# Patient Record
Sex: Female | Born: 2005
Health system: Southern US, Community
[De-identification: ages and names within clinical notes are randomized; demographics above are authoritative.]

---
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
## 2023-06-12 ENCOUNTER — Other Ambulatory Visit (HOSPITAL_COMMUNITY): Payer: Self-pay

## 2023-06-12 DIAGNOSIS — F411 Generalized anxiety disorder: Secondary | ICD-10-CM | POA: Diagnosis not present

## 2023-06-12 DIAGNOSIS — F3181 Bipolar II disorder: Secondary | ICD-10-CM | POA: Diagnosis not present

## 2023-06-12 DIAGNOSIS — F4010 Social phobia, unspecified: Secondary | ICD-10-CM | POA: Diagnosis not present

## 2023-06-12 DIAGNOSIS — F900 Attention-deficit hyperactivity disorder, predominantly inattentive type: Secondary | ICD-10-CM | POA: Diagnosis not present

## 2023-06-12 MED ORDER — QUETIAPINE FUMARATE 50 MG PO TABS
50.0000 mg | ORAL_TABLET | Freq: Every day | ORAL | 1 refills | Status: AC
  Filled 2023-06-12: qty 30, 30d supply, fill #0
  Filled 2023-07-07: qty 30, 30d supply, fill #1

## 2023-07-08 ENCOUNTER — Other Ambulatory Visit (HOSPITAL_COMMUNITY): Payer: Self-pay

## 2023-08-14 ENCOUNTER — Other Ambulatory Visit (HOSPITAL_COMMUNITY): Payer: Self-pay

## 2023-08-14 DIAGNOSIS — F4010 Social phobia, unspecified: Secondary | ICD-10-CM | POA: Diagnosis not present

## 2023-08-14 DIAGNOSIS — F3181 Bipolar II disorder: Secondary | ICD-10-CM | POA: Diagnosis not present

## 2023-08-14 DIAGNOSIS — F411 Generalized anxiety disorder: Secondary | ICD-10-CM | POA: Diagnosis not present

## 2023-08-14 DIAGNOSIS — F900 Attention-deficit hyperactivity disorder, predominantly inattentive type: Secondary | ICD-10-CM | POA: Diagnosis not present

## 2023-08-15 ENCOUNTER — Other Ambulatory Visit (HOSPITAL_COMMUNITY): Payer: Self-pay

## 2023-08-15 MED ORDER — QUETIAPINE FUMARATE 100 MG PO TABS
100.0000 mg | ORAL_TABLET | Freq: Every day | ORAL | 1 refills | Status: AC
  Filled 2023-11-06: qty 30, 30d supply, fill #0

## 2023-08-15 MED ORDER — QUETIAPINE FUMARATE 100 MG PO TABS
100.0000 mg | ORAL_TABLET | Freq: Every day | ORAL | 1 refills | Status: AC
  Filled 2023-09-16: qty 30, 30d supply, fill #1

## 2023-09-15 ENCOUNTER — Other Ambulatory Visit (HOSPITAL_COMMUNITY): Payer: Self-pay

## 2023-09-16 ENCOUNTER — Other Ambulatory Visit (HOSPITAL_COMMUNITY): Payer: Self-pay

## 2023-09-18 ENCOUNTER — Other Ambulatory Visit (HOSPITAL_COMMUNITY): Payer: Self-pay

## 2023-10-17 DIAGNOSIS — F4010 Social phobia, unspecified: Secondary | ICD-10-CM | POA: Diagnosis not present

## 2023-10-17 DIAGNOSIS — F411 Generalized anxiety disorder: Secondary | ICD-10-CM | POA: Diagnosis not present

## 2023-10-17 DIAGNOSIS — F3181 Bipolar II disorder: Secondary | ICD-10-CM | POA: Diagnosis not present

## 2023-11-06 ENCOUNTER — Other Ambulatory Visit (HOSPITAL_COMMUNITY): Payer: Self-pay

## 2023-11-18 ENCOUNTER — Other Ambulatory Visit (HOSPITAL_COMMUNITY): Payer: Self-pay

## 2023-12-26 ENCOUNTER — Other Ambulatory Visit (HOSPITAL_COMMUNITY): Payer: Self-pay

## 2023-12-26 DIAGNOSIS — F4010 Social phobia, unspecified: Secondary | ICD-10-CM | POA: Diagnosis not present

## 2023-12-26 DIAGNOSIS — F900 Attention-deficit hyperactivity disorder, predominantly inattentive type: Secondary | ICD-10-CM | POA: Diagnosis not present

## 2023-12-26 DIAGNOSIS — F3181 Bipolar II disorder: Secondary | ICD-10-CM | POA: Diagnosis not present

## 2023-12-26 DIAGNOSIS — F411 Generalized anxiety disorder: Secondary | ICD-10-CM | POA: Diagnosis not present

## 2023-12-26 MED ORDER — HYDROXYZINE HCL 25 MG PO TABS
25.0000 mg | ORAL_TABLET | Freq: Every day | ORAL | 1 refills | Status: AC
  Filled 2023-12-26: qty 30, 30d supply, fill #0

## 2023-12-26 MED ORDER — ARIPIPRAZOLE 5 MG PO TABS
5.0000 mg | ORAL_TABLET | Freq: Every day | ORAL | 0 refills | Status: AC
  Filled 2023-12-26: qty 90, 90d supply, fill #0

## 2023-12-27 ENCOUNTER — Other Ambulatory Visit (HOSPITAL_COMMUNITY): Payer: Self-pay

## 2024-02-15 ENCOUNTER — Other Ambulatory Visit (HOSPITAL_COMMUNITY): Payer: Self-pay

## 2024-02-15 DIAGNOSIS — R1011 Right upper quadrant pain: Secondary | ICD-10-CM | POA: Diagnosis not present

## 2024-02-15 DIAGNOSIS — R071 Chest pain on breathing: Secondary | ICD-10-CM | POA: Diagnosis not present

## 2024-02-15 MED ORDER — IBUPROFEN 400 MG PO TABS: 400.0000 mg | ORAL_TABLET | Freq: Four times a day (QID) | ORAL | 0 refills | Status: AC

## 2024-02-17 ENCOUNTER — Other Ambulatory Visit: Payer: Self-pay | Admitting: Physician Assistant

## 2024-02-17 ENCOUNTER — Encounter: Payer: Self-pay | Admitting: Physician Assistant

## 2024-02-17 DIAGNOSIS — R1011 Right upper quadrant pain: Secondary | ICD-10-CM

## 2024-02-19 ENCOUNTER — Ambulatory Visit
Admission: RE | Admit: 2024-02-19 | Discharge: 2024-02-19 | Disposition: A | Discharge status: Home / Self Care | Source: Ambulatory Visit | Attending: Physician Assistant | Admitting: Physician Assistant

## 2024-02-19 DIAGNOSIS — R1011 Right upper quadrant pain: Secondary | ICD-10-CM | POA: Diagnosis not present

## 2024-04-08 ENCOUNTER — Other Ambulatory Visit (HOSPITAL_COMMUNITY): Payer: Self-pay

## 2024-04-08 DIAGNOSIS — F411 Generalized anxiety disorder: Secondary | ICD-10-CM | POA: Diagnosis not present

## 2024-04-08 DIAGNOSIS — Z79899 Other long term (current) drug therapy: Secondary | ICD-10-CM | POA: Diagnosis not present

## 2024-04-08 DIAGNOSIS — F900 Attention-deficit hyperactivity disorder, predominantly inattentive type: Secondary | ICD-10-CM | POA: Diagnosis not present

## 2024-04-08 DIAGNOSIS — F3181 Bipolar II disorder: Secondary | ICD-10-CM | POA: Diagnosis not present

## 2024-04-08 DIAGNOSIS — F4010 Social phobia, unspecified: Secondary | ICD-10-CM | POA: Diagnosis not present

## 2024-04-08 MED ORDER — ARIPIPRAZOLE 5 MG PO TABS
ORAL_TABLET | ORAL | 0 refills | Status: DC
  Filled 2024-04-08: qty 135, 90d supply, fill #0

## 2024-05-06 ENCOUNTER — Other Ambulatory Visit (HOSPITAL_COMMUNITY): Payer: Self-pay

## 2024-05-06 DIAGNOSIS — L700 Acne vulgaris: Secondary | ICD-10-CM | POA: Diagnosis not present

## 2024-05-06 MED ORDER — DAPSONE 5 % EX GEL
1.0000 | Freq: Every morning | CUTANEOUS | 5 refills | Status: AC
  Filled 2024-05-06: qty 60, 30d supply, fill #0
  Filled 2024-06-24 – 2024-06-26 (×2): qty 60, 30d supply, fill #1

## 2024-05-06 MED ORDER — TRETINOIN 0.05 % EX CREA
1.0000 | TOPICAL_CREAM | Freq: Every day | CUTANEOUS | 11 refills | Status: AC
  Filled 2024-05-06: qty 45, 30d supply, fill #0
  Filled 2024-05-12 (×2): qty 45, 30d supply, fill #1
  Filled 2024-06-30: qty 45, 30d supply, fill #2

## 2024-05-06 MED ORDER — DOXYCYCLINE HYCLATE 100 MG PO CAPS
100.0000 mg | ORAL_CAPSULE | Freq: Two times a day (BID) | ORAL | 2 refills | Status: AC
  Filled 2024-05-06: qty 60, 30d supply, fill #0
  Filled 2024-06-24 – 2024-06-26 (×2): qty 60, 30d supply, fill #1

## 2024-05-12 ENCOUNTER — Other Ambulatory Visit (HOSPITAL_COMMUNITY): Payer: Self-pay

## 2024-06-17 ENCOUNTER — Other Ambulatory Visit (HOSPITAL_COMMUNITY): Payer: Self-pay

## 2024-06-17 ENCOUNTER — Other Ambulatory Visit: Payer: Self-pay

## 2024-06-17 DIAGNOSIS — F411 Generalized anxiety disorder: Secondary | ICD-10-CM | POA: Diagnosis not present

## 2024-06-17 DIAGNOSIS — F3181 Bipolar II disorder: Secondary | ICD-10-CM | POA: Diagnosis not present

## 2024-06-17 MED ORDER — ARIPIPRAZOLE 5 MG PO TABS
7.5000 mg | ORAL_TABLET | Freq: Every day | ORAL | 1 refills | Status: AC
  Filled 2024-06-17 – 2024-06-19 (×2): qty 135, 90d supply, fill #0

## 2024-06-17 MED ORDER — HYDROXYZINE HCL 25 MG PO TABS
25.0000 mg | ORAL_TABLET | Freq: Every day | ORAL | 1 refills | Status: AC | PRN
  Filled 2024-06-17: qty 30, 30d supply, fill #0

## 2024-06-19 ENCOUNTER — Other Ambulatory Visit (HOSPITAL_COMMUNITY): Payer: Self-pay

## 2024-06-19 ENCOUNTER — Other Ambulatory Visit: Payer: Self-pay

## 2024-06-24 ENCOUNTER — Other Ambulatory Visit: Payer: Self-pay

## 2024-06-24 ENCOUNTER — Other Ambulatory Visit (HOSPITAL_COMMUNITY): Payer: Self-pay

## 2024-06-25 ENCOUNTER — Other Ambulatory Visit: Payer: Self-pay
